# Patient Record
Sex: Female | Born: 1950 | Race: White | Hispanic: No | Marital: Married | Smoking: Current every day smoker
Health system: Southern US, Community
[De-identification: ages and names within clinical notes are randomized; demographics above are authoritative.]

## PROBLEM LIST (undated history)

## (undated) DIAGNOSIS — R7303 Prediabetes: Secondary | ICD-10-CM

## (undated) DIAGNOSIS — I10 Essential (primary) hypertension: Secondary | ICD-10-CM

## (undated) HISTORY — PX: ABDOMINAL HYSTERECTOMY: SHX81

---

## 2006-06-22 ENCOUNTER — Emergency Department: Payer: Self-pay | Admitting: Emergency Medicine

## 2013-08-06 ENCOUNTER — Ambulatory Visit: Payer: Self-pay

## 2019-02-08 ENCOUNTER — Other Ambulatory Visit: Payer: Self-pay

## 2019-02-08 DIAGNOSIS — Z20822 Contact with and (suspected) exposure to covid-19: Secondary | ICD-10-CM

## 2019-02-13 LAB — NOVEL CORONAVIRUS, NAA: SARS-CoV-2, NAA: DETECTED — AB

## 2019-06-10 ENCOUNTER — Emergency Department
Admission: EM | Admit: 2019-06-10 | Discharge: 2019-06-10 | Disposition: A | Discharge status: Home / Self Care | Payer: Medicare Other | Attending: Emergency Medicine | Admitting: Emergency Medicine

## 2019-06-10 ENCOUNTER — Other Ambulatory Visit: Payer: Self-pay

## 2019-06-10 ENCOUNTER — Emergency Department: Payer: Medicare Other

## 2019-06-10 DIAGNOSIS — F17210 Nicotine dependence, cigarettes, uncomplicated: Secondary | ICD-10-CM | POA: Insufficient documentation

## 2019-06-10 DIAGNOSIS — N3091 Cystitis, unspecified with hematuria: Secondary | ICD-10-CM | POA: Insufficient documentation

## 2019-06-10 DIAGNOSIS — I10 Essential (primary) hypertension: Secondary | ICD-10-CM | POA: Insufficient documentation

## 2019-06-10 DIAGNOSIS — N3090 Cystitis, unspecified without hematuria: Secondary | ICD-10-CM

## 2019-06-10 DIAGNOSIS — R319 Hematuria, unspecified: Secondary | ICD-10-CM | POA: Diagnosis present

## 2019-06-10 HISTORY — DX: Prediabetes: R73.03

## 2019-06-10 HISTORY — DX: Essential (primary) hypertension: I10

## 2019-06-10 LAB — URINALYSIS, COMPLETE (UACMP) WITH MICROSCOPIC
Bacteria, UA: NONE SEEN
RBC / HPF: >50 RBC/hpf — ABNORMAL HIGH (ref 0–5)
Specific Gravity, Urine: 1.01 (ref 1.005–1.030)
Squamous Epithelial / LPF: NONE SEEN (ref 0–5)

## 2019-06-10 LAB — BASIC METABOLIC PANEL
Anion gap: 12 (ref 5–15)
BUN: 13 mg/dL (ref 8–23)
CO2: 27 mmol/L (ref 22–32)
Calcium: 9.8 mg/dL (ref 8.9–10.3)
Chloride: 99 mmol/L (ref 98–111)
Creatinine, Ser: 1.05 mg/dL — ABNORMAL HIGH (ref 0.44–1.00)
GFR calc Af Amer: >60 mL/min (ref >60)
GFR calc non Af Amer: 54 mL/min — ABNORMAL LOW (ref >60)
Glucose, Bld: 121 mg/dL — ABNORMAL HIGH (ref 70–99)
Potassium: 3.9 mmol/L (ref 3.5–5.1)
Sodium: 138 mmol/L (ref 135–145)

## 2019-06-10 LAB — CBC
HCT: 48 % — ABNORMAL HIGH (ref 36.0–46.0)
Hemoglobin: 15.6 g/dL — ABNORMAL HIGH (ref 12.0–15.0)
MCH: 29.3 pg (ref 26.0–34.0)
MCHC: 32.5 g/dL (ref 30.0–36.0)
MCV: 90.2 fL (ref 80.0–100.0)
Platelets: 324 10*3/uL (ref 150–400)
RBC: 5.32 MIL/uL — ABNORMAL HIGH (ref 3.87–5.11)
RDW: 13 % (ref 11.5–15.5)
WBC: 15.7 10*3/uL — ABNORMAL HIGH (ref 4.0–10.5)
nRBC: 0 % (ref 0.0–0.2)

## 2019-06-10 MED ORDER — PHENAZOPYRIDINE HCL 200 MG PO TABS
200.0000 mg | ORAL_TABLET | Freq: Once | ORAL | Status: AC
  Administered 2019-06-10: 16:00:00 200 mg via ORAL
  Filled 2019-06-10: qty 1

## 2019-06-10 MED ORDER — SULFAMETHOXAZOLE-TRIMETHOPRIM 800-160 MG PO TABS
1.0000 | ORAL_TABLET | Freq: Once | ORAL | Status: AC
  Administered 2019-06-10: 1 via ORAL
  Filled 2019-06-10: qty 1

## 2019-06-10 MED ORDER — PHENAZOPYRIDINE HCL 200 MG PO TABS: 200.0000 mg | ORAL_TABLET | Freq: Three times a day (TID) | ORAL | 0 refills | Status: AC | PRN

## 2019-06-10 MED ORDER — SULFAMETHOXAZOLE-TRIMETHOPRIM 800-160 MG PO TABS: 1.0000 | ORAL_TABLET | Freq: Two times a day (BID) | ORAL | 0 refills | Status: DC

## 2019-06-10 NOTE — ED Triage Notes (Signed)
Pt states back pain x 3 days with urinary frequency. States hematuria. Hx of kidney stones. A&O, ambulatory.

## 2019-06-10 NOTE — ED Notes (Signed)
Dr Williams at bedside 

## 2019-06-10 NOTE — ED Provider Notes (Signed)
[facility] Emergency Department Provider Note       Time seen: ----------------------------------------- 1:48 PM on 06/10/2019 -----------------------------------------   I have reviewed the triage vital signs and the nursing notes.  HISTORY   Chief Complaint Hematuria    HPI Kristen Mcconnell is a 68 y.o. female with a history of hypertension, prediabetes who presents to the ED for back pain with 3 days of urinary frequency.  She has had hematuria with a history of kidney stones.  Discomfort is 5 out of 10 in her pelvis.  She denies fevers, chills or other complaints.  Past Medical History:  Diagnosis Date  . Hypertension   . Prediabetes     There are no active problems to display for this patient.   Past Surgical History:  Procedure Laterality Date  . ABDOMINAL HYSTERECTOMY      Allergies Penicillins  Social History Social History   Tobacco Use  . Smoking status: Current Every Day Smoker  Substance Use Topics  . Alcohol use: Not Currently  . Drug use: Not on file    Review of Systems Constitutional: Negative for fever. Cardiovascular: Negative for chest pain. Respiratory: Negative for shortness of breath. Gastrointestinal: Positive for pelvic pain Genitourinary: Positive for hematuria, urinary frequency Musculoskeletal: Positive for back pain Skin: Negative for rash. Neurological: Negative for headaches, focal weakness or numbness.  All systems negative/normal/unremarkable except as stated in the HPI  ____________________________________________   PHYSICAL EXAM:  VITAL SIGNS: ED Triage Vitals  Enc Vitals Group     BP 06/10/19 1112 (!) 155/92     Pulse Rate 06/10/19 1111 91     Resp 06/10/19 1111 18     Temp 06/10/19 1111 99.4 F (37.4 C)     Temp Source 06/10/19 1111 Oral     SpO2 06/10/19 1111 98 %     Weight 06/10/19 1112 230 lb (104.3 kg)     Height 06/10/19 1112 5\' 5"  (1.651 m)     Head Circumference --       Peak Flow --      Pain Score 06/10/19 1112 5     Pain Loc --      Pain Edu? --      Excl. in GC? --    Constitutional: Alert and oriented. Well appearing and in no distress. Eyes: Conjunctivae are normal. Normal extraocular movements. Cardiovascular: Normal rate, regular rhythm. No murmurs, rubs, or gallops. Respiratory: Normal respiratory effort without tachypnea nor retractions. Breath sounds are clear and equal bilaterally. No wheezes/rales/rhonchi. Gastrointestinal: Soft and nontender. Normal bowel sounds Musculoskeletal: Nontender with normal range of motion in extremities. No lower extremity tenderness nor edema. Neurologic:  Normal speech and language. No gross focal neurologic deficits are appreciated.  Skin:  Skin is warm, dry and intact. No rash noted. Psychiatric: Mood and affect are normal. Speech and behavior are normal.  ____________________________________________  ED COURSE:  As part of my medical decision making, I reviewed the following data within the electronic MEDICAL RECORD NUMBER History obtained from family if available, nursing notes, old chart and ekg, as well as notes from prior ED visits. Patient presented for possible renal colic, we will assess with labs and imaging as indicated at this time.   Procedures  Kristen Mcconnell was evaluated in Emergency Department on 06/10/2019 for the symptoms described in the history of present illness. She was evaluated in the context of the global COVID-19 pandemic, which necessitated consideration that the patient might be at risk for  infection with the SARS-CoV-2 virus that causes COVID-19. Institutional protocols and algorithms that pertain to the evaluation of patients at risk for COVID-19 are in a state of rapid change based on information released by regulatory bodies including the CDC and federal and state organizations. These policies and algorithms were followed during the patient's care in the ED.   ____________________________________________   LABS (pertinent positives/negatives)  Labs Reviewed  URINALYSIS, COMPLETE (UACMP) WITH MICROSCOPIC - Abnormal; Notable for the following components:      Result Value   Color, Urine RED (*)    APPearance CLOUDY (*)    Glucose, UA   (*)    Value: TEST NOT REPORTED DUE TO COLOR INTERFERENCE OF URINE PIGMENT   Hgb urine dipstick   (*)    Value: TEST NOT REPORTED DUE TO COLOR INTERFERENCE OF URINE PIGMENT   Bilirubin Urine   (*)    Value: TEST NOT REPORTED DUE TO COLOR INTERFERENCE OF URINE PIGMENT   Ketones, ur   (*)    Value: TEST NOT REPORTED DUE TO COLOR INTERFERENCE OF URINE PIGMENT   Protein, ur   (*)    Value: TEST NOT REPORTED DUE TO COLOR INTERFERENCE OF URINE PIGMENT   Nitrite   (*)    Value: TEST NOT REPORTED DUE TO COLOR INTERFERENCE OF URINE PIGMENT   Leukocytes,Ua   (*)    Value: TEST NOT REPORTED DUE TO COLOR INTERFERENCE OF URINE PIGMENT   RBC / HPF >50 (*)    All other components within normal limits  BASIC METABOLIC PANEL - Abnormal; Notable for the following components:   Glucose, Bld 121 (*)    Creatinine, Ser 1.05 (*)    GFR calc non Af Amer 54 (*)    All other components within normal limits  CBC - Abnormal; Notable for the following components:   WBC 15.7 (*)    RBC 5.32 (*)    Hemoglobin 15.6 (*)    HCT 48.0 (*)    All other components within normal limits    RADIOLOGY Images were viewed by me  CT renal protocol  IMPRESSION:  1. Negative for hydronephrosis or ureteral stone. Nonspecific  perinephric fat stranding.  2. Descending and sigmoid colon diverticular disease without acute  inflammatory change.  ____________________________________________   DIFFERENTIAL DIAGNOSIS   Renal colic, UTI, pyelonephritis, muscle strain  FINAL ASSESSMENT AND PLAN  Cystitis   Plan: The patient had presented for flank pain and hematuria. Patient's labs did reveal significant hematuria with leukocytosis.  Patient's imaging was unremarkable.  She will be given Pyridium and Bactrim.  Otherwise she is cleared for outpatient follow-up.   Laurence Aly, MD    Note: This note was generated in part or whole with voice recognition software. Voice recognition is usually quite accurate but there are transcription errors that can and very often do occur. I apologize for any typographical errors that were not detected and corrected.     Earleen Newport, MD 06/10/19 [phone]

## 2019-10-26 ENCOUNTER — Ambulatory Visit: Payer: Medicare Other | Attending: Internal Medicine

## 2019-10-26 DIAGNOSIS — Z23 Encounter for immunization: Secondary | ICD-10-CM

## 2019-10-26 NOTE — Progress Notes (Signed)
   Covid-19 Vaccination Clinic  Name:  Kristen Mcconnell    MRN: [id number] DOB: 1951-06-25  10/26/2019  [name] was observed post Covid-19 immunization for 15 minutes without incident. She was provided with Vaccine Information Sheet and instruction to access the V-Safe system.   [name] was instructed to call 911 with any severe reactions post vaccine: Marland Kitchen Difficulty breathing  . Swelling of face and throat  . A fast heartbeat  . A bad rash all over body  . Dizziness and weakness   Immunizations Administered    Name Date Dose VIS Date Route   Moderna COVID-19 Vaccine 10/26/2019  9:19 AM 0.5 mL 06/26/2019 Intramuscular   Manufacturer: Moderna   Lot: [id number]   NDC: 96722-773-75

## 2019-11-27 ENCOUNTER — Ambulatory Visit: Payer: Medicare Other

## 2021-01-05 IMAGING — CT CT RENAL STONE PROTOCOL
2 of 4 series · 17 of 46 positions shown, 19 images · non-contrast
Comparison: None.

CLINICAL DATA: Back pain with urinary frequency

EXAM:
CT ABDOMEN AND PELVIS WITHOUT CONTRAST
TECHNIQUE: Multidetector CT imaging of the abdomen and pelvis was performed
following the standard protocol without IV contrast.

[Series 2: stone full standard · axial · 0.85mm/px · z∈[-502,-102]mm · 14 of 88 slices shown, 16 images]
[im 4/88  soft-tissue]
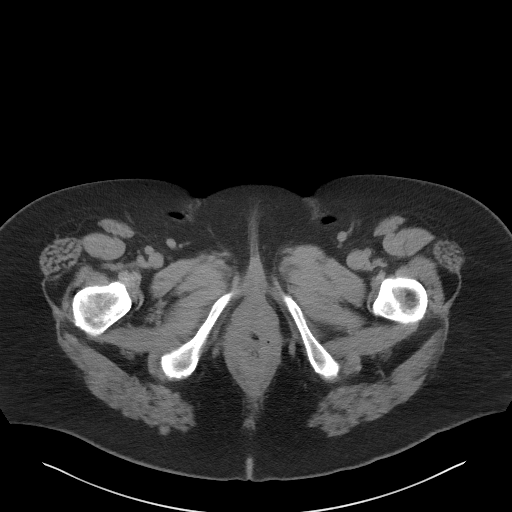
[im 4/88  bone]
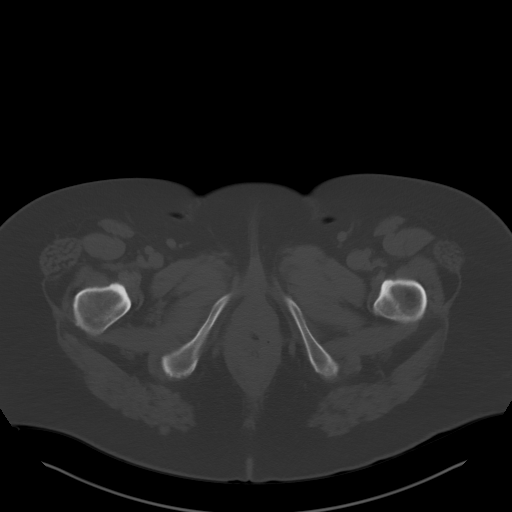
[im 12/88  soft-tissue]
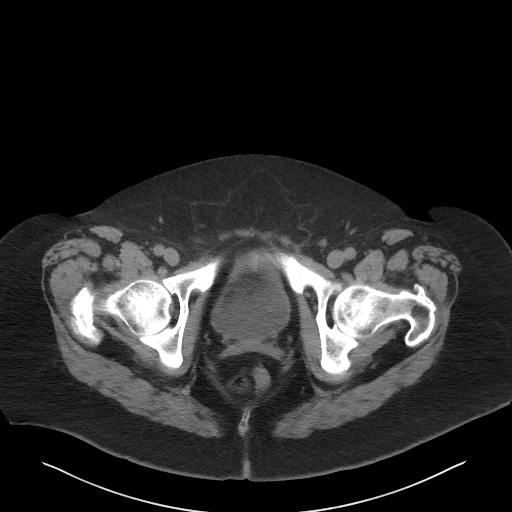
[im 16/88  soft-tissue]
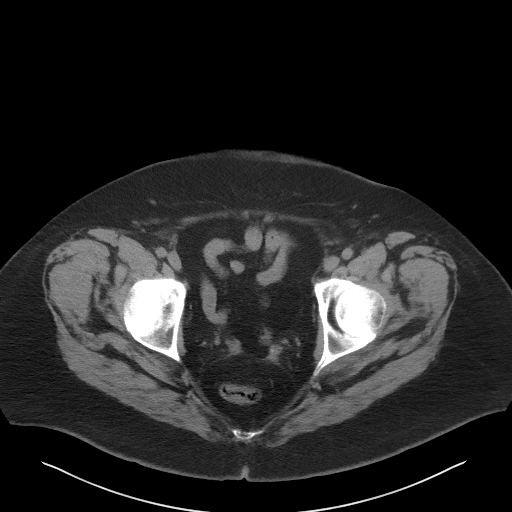
[im 23/88  soft-tissue]
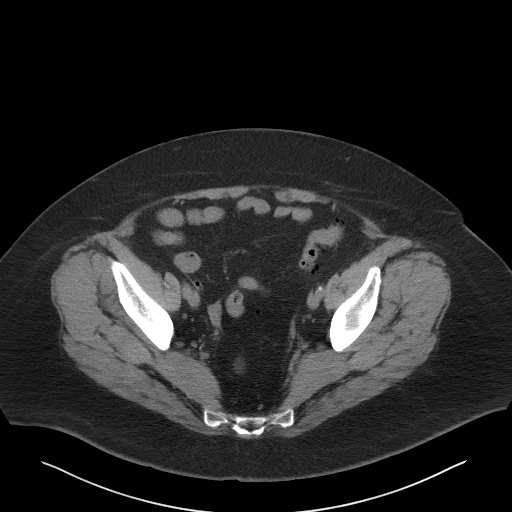
[im 31/88  soft-tissue]
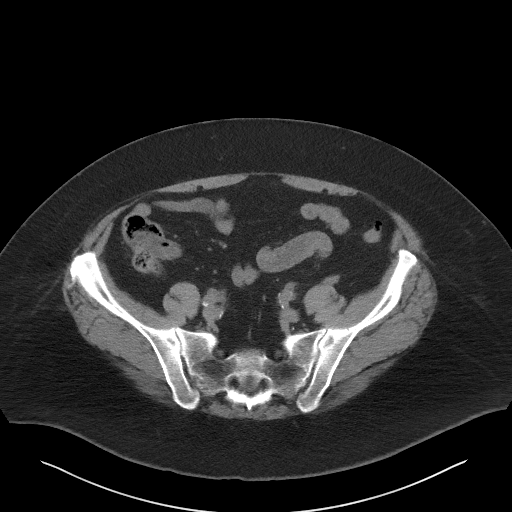
[im 35/88  soft-tissue]
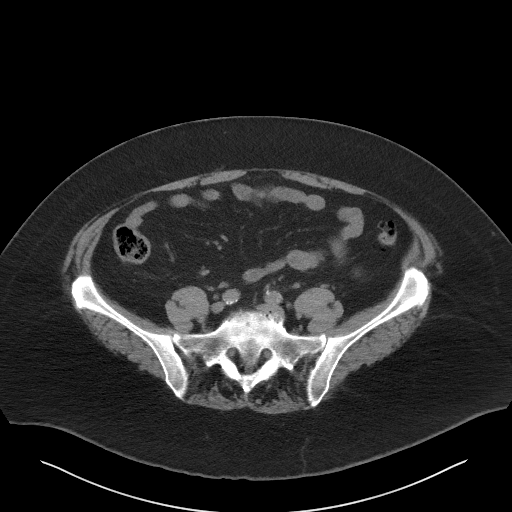
[im 42/88  soft-tissue]
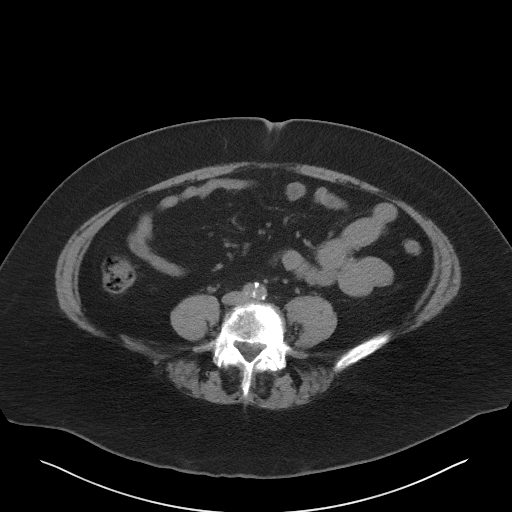
[im 46/88  soft-tissue]
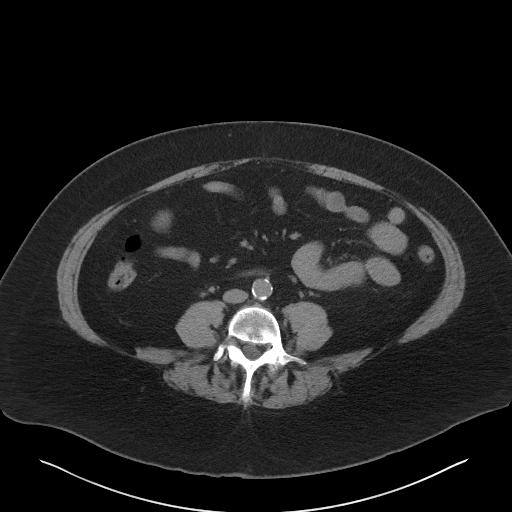
[im 53/88  soft-tissue]
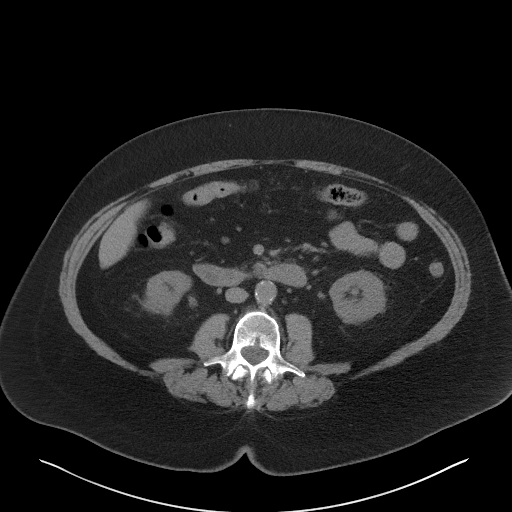
[im 53/88  bone]
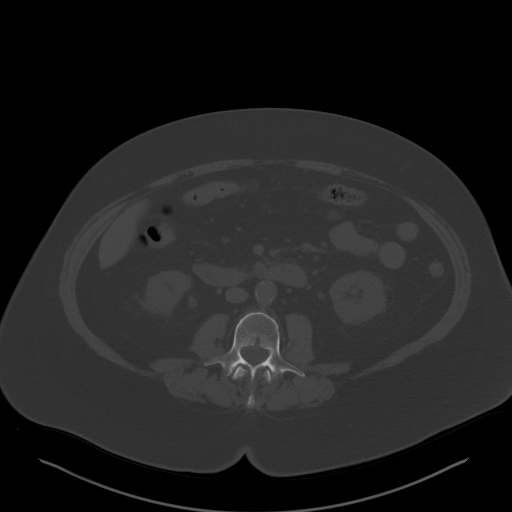
[im 57/88  soft-tissue]
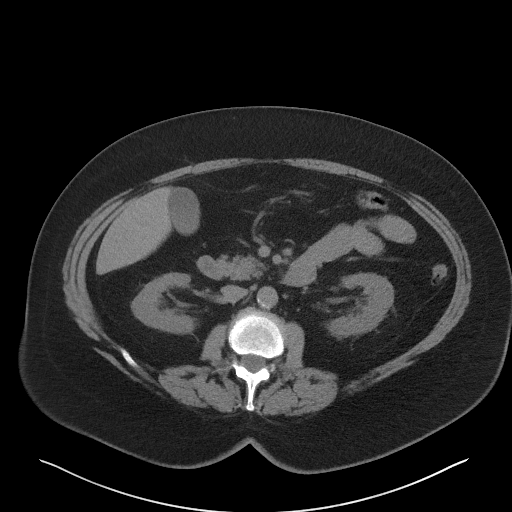
[im 65/88  soft-tissue]
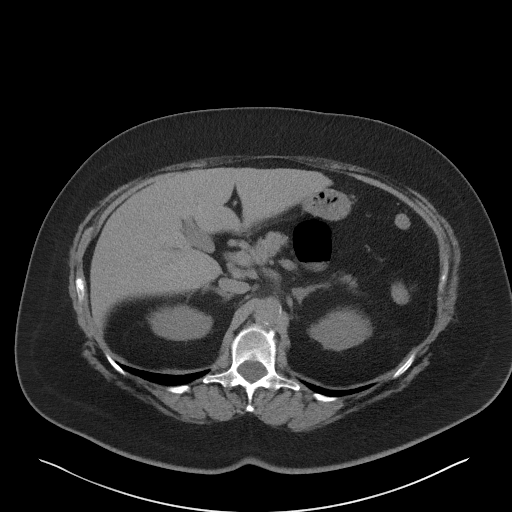
[im 72/88  soft-tissue]
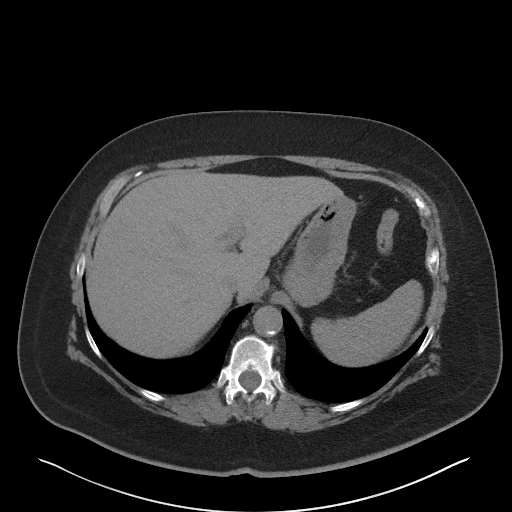
[im 76/88  soft-tissue]
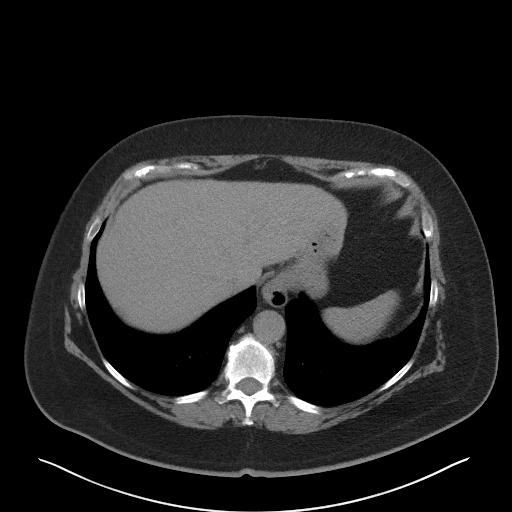
[im 84/88  soft-tissue]
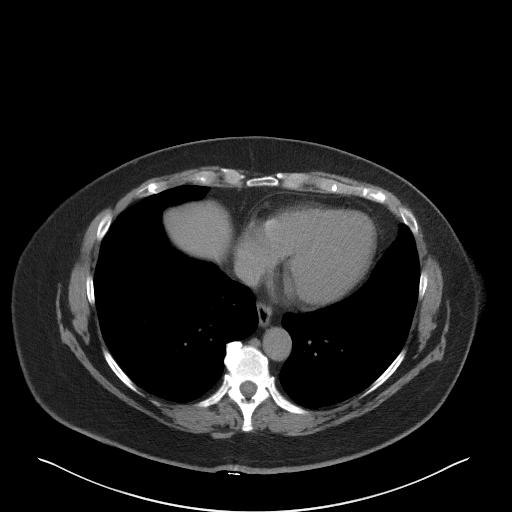

[Series 5: coronal · coronal · 0.85mm/px · 3 of 161 slices shown]
[im 54/161  soft-tissue]
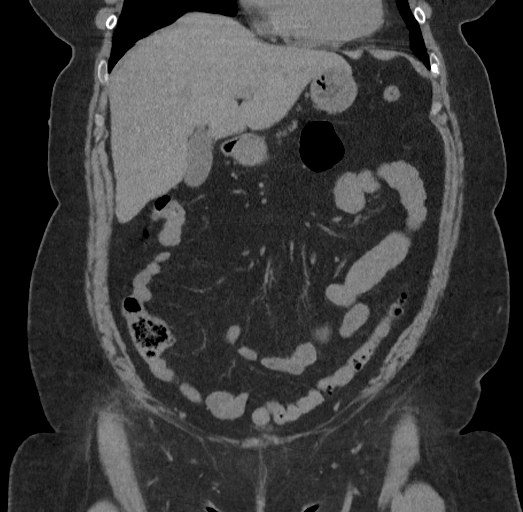
[im 72/161  soft-tissue]
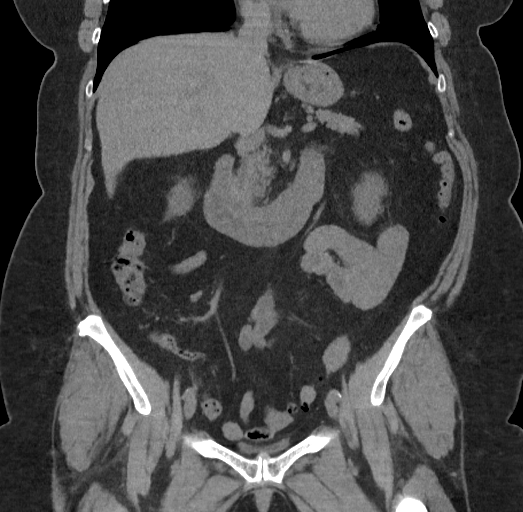
[im 89/161  soft-tissue]
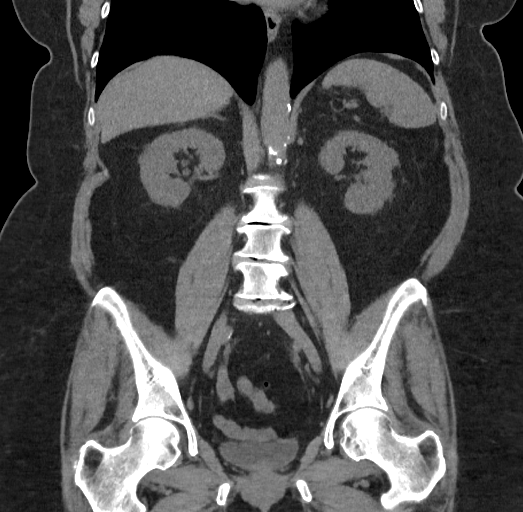

[17 of 46 positions shown; findings below may reference images not displayed]

FINDINGS: Lower chest: Lung bases demonstrate no acute consolidation or
effusion. Heart size within normal limits. Small hiatal hernia.

Hepatobiliary: No focal liver abnormality is seen. No gallstones,
gallbladder wall thickening, or biliary dilatation.

Pancreas: Unremarkable. No pancreatic ductal dilatation or
surrounding inflammatory changes.

Spleen: Normal in size without focal abnormality.

Adrenals/Urinary Tract: Adrenal glands are within normal limits.
Negative for hydronephrosis. Nonspecific perinephric fat stranding.
Urinary bladder is unremarkable.

Stomach/Bowel: Stomach is within normal limits. Appendix appears
normal. No evidence of bowel wall thickening, distention, or
inflammatory changes. Descending and sigmoid colon diverticular
disease without acute inflammatory change.

Vascular/Lymphatic: No significant adenopathy. Moderate aortic
atherosclerosis without aneurysm

Reproductive: Status post hysterectomy. No adnexal masses.

Other: Negative for free air or free fluid. Small fat in the
umbilical region

Musculoskeletal: Degenerative changes of the lower lumbar spine. No
acute osseous abnormality
IMPRESSION: 1. Negative for hydronephrosis or ureteral stone. Nonspecific
perinephric fat stranding.
2. Descending and sigmoid colon diverticular disease without acute
inflammatory change.

## 2023-06-09 ENCOUNTER — Encounter: Payer: Self-pay | Admitting: Emergency Medicine

## 2023-06-09 ENCOUNTER — Emergency Department
Admission: EM | Admit: 2023-06-09 | Discharge: 2023-06-09 | Disposition: A | Discharge status: Home / Self Care | Payer: Medicare Other | Attending: Emergency Medicine | Admitting: Emergency Medicine

## 2023-06-09 ENCOUNTER — Other Ambulatory Visit: Payer: Self-pay

## 2023-06-09 DIAGNOSIS — N390 Urinary tract infection, site not specified: Secondary | ICD-10-CM | POA: Diagnosis not present

## 2023-06-09 DIAGNOSIS — R300 Dysuria: Secondary | ICD-10-CM | POA: Diagnosis present

## 2023-06-09 LAB — CBC WITH DIFFERENTIAL/PLATELET
Abs Immature Granulocytes: 0.03 10*3/uL (ref 0.00–0.07)
Basophils Absolute: 0.1 10*3/uL (ref 0.0–0.1)
Basophils Relative: 1 %
Eosinophils Absolute: 0.4 10*3/uL (ref 0.0–0.5)
Eosinophils Relative: 3 %
HCT: 47.7 % — ABNORMAL HIGH (ref 36.0–46.0)
Hemoglobin: 15.2 g/dL — ABNORMAL HIGH (ref 12.0–15.0)
Immature Granulocytes: 0 %
Lymphocytes Relative: 31 %
Lymphs Abs: 3.7 10*3/uL (ref 0.7–4.0)
MCH: 29.5 pg (ref 26.0–34.0)
MCHC: 31.9 g/dL (ref 30.0–36.0)
MCV: 92.4 fL (ref 80.0–100.0)
Monocytes Absolute: 1.1 10*3/uL — ABNORMAL HIGH (ref 0.1–1.0)
Monocytes Relative: 9 %
Neutro Abs: 6.8 10*3/uL (ref 1.7–7.7)
Neutrophils Relative %: 56 %
Platelets: 277 10*3/uL (ref 150–400)
RBC: 5.16 MIL/uL — ABNORMAL HIGH (ref 3.87–5.11)
RDW: 13.4 % (ref 11.5–15.5)
WBC: 12 10*3/uL — ABNORMAL HIGH (ref 4.0–10.5)
nRBC: 0 % (ref 0.0–0.2)

## 2023-06-09 LAB — COMPREHENSIVE METABOLIC PANEL
ALT: 14 U/L (ref 0–44)
AST: 17 U/L (ref 15–41)
Albumin: 4 g/dL (ref 3.5–5.0)
Alkaline Phosphatase: 56 U/L (ref 38–126)
Anion gap: 8 (ref 5–15)
BUN: 7 mg/dL — ABNORMAL LOW (ref 8–23)
CO2: 25 mmol/L (ref 22–32)
Calcium: 9.1 mg/dL (ref 8.9–10.3)
Chloride: 103 mmol/L (ref 98–111)
Creatinine, Ser: 0.85 mg/dL (ref 0.44–1.00)
GFR, Estimated: >60 mL/min (ref >60)
Glucose, Bld: 121 mg/dL — ABNORMAL HIGH (ref 70–99)
Potassium: 3.7 mmol/L (ref 3.5–5.1)
Sodium: 136 mmol/L (ref 135–145)
Total Bilirubin: 0.2 mg/dL (ref <1.2)
Total Protein: 7.2 g/dL (ref 6.5–8.1)

## 2023-06-09 LAB — URINALYSIS, ROUTINE W REFLEX MICROSCOPIC
Bilirubin Urine: NEGATIVE
Glucose, UA: NEGATIVE mg/dL
Ketones, ur: NEGATIVE mg/dL
Nitrite: NEGATIVE
Protein, ur: 30 mg/dL — AB
RBC / HPF: >50 RBC/hpf (ref 0–5)
Specific Gravity, Urine: 1.006 (ref 1.005–1.030)
WBC, UA: >50 WBC/hpf (ref 0–5)
pH: 6 (ref 5.0–8.0)

## 2023-06-09 LAB — LIPASE, BLOOD: Lipase: 29 U/L (ref 11–51)

## 2023-06-09 MED ORDER — SULFAMETHOXAZOLE-TRIMETHOPRIM 800-160 MG PO TABS: 1.0000 | ORAL_TABLET | Freq: Two times a day (BID) | ORAL | 0 refills | Status: DC

## 2023-06-09 MED ORDER — SULFAMETHOXAZOLE-TRIMETHOPRIM 800-160 MG PO TABS
1.0000 | ORAL_TABLET | Freq: Once | ORAL | Status: AC
  Administered 2023-06-09: 1 via ORAL
  Filled 2023-06-09: qty 1

## 2023-06-09 MED ORDER — SULFAMETHOXAZOLE-TRIMETHOPRIM 800-160 MG PO TABS: 1.0000 | ORAL_TABLET | Freq: Two times a day (BID) | ORAL | 0 refills | Status: AC

## 2023-06-09 NOTE — ED Provider Notes (Signed)
[facility] Provider Note    Event Date/Time   First MD Initiated Contact with Patient 06/09/23 [phone]     (approximate)   History   Abdominal Pain   HPI  Kristen Mcconnell is a 72 y.o. female   Past medical history of pretension and prediabetic who presents to the emergency department with dysuria and frequency, suprapubic discomfort while urinating.  No back pain, no fevers.  3 days of symptoms.  Yesterday she started taking an old antibiotic that she had been prescribed several years ago for urinary tract infection.  She has had kidney stones in the past.  This pain feels different than her kidney stone flank pain.  Independent Historian contributed to assessment above: Her husband is at bedside to corroborate information given above  External Medical Documents Reviewed: Emergency department visit in 2020 for cystitis/hematuria      Physical Exam   Triage Vital Signs: ED Triage Vitals  Encounter Vitals Group     BP 06/09/23 0238 (!) 171/93     Systolic BP Percentile --      Diastolic BP Percentile --      Pulse Rate 06/09/23 0238 68     Resp 06/09/23 0238 18     Temp 06/09/23 0238 97.7 F (36.5 C)     Temp Source 06/09/23 0238 Oral     SpO2 06/09/23 0238 100 %     Weight 06/09/23 0234 209 lb (94.8 kg)     Height 06/09/23 0234 5\' 4"  (1.626 m)     Head Circumference --      Peak Flow --      Pain Score 06/09/23 0234 7     Pain Loc --      Pain Education --      Exclude from Growth Chart --     Most recent vital signs: Vitals:   06/09/23 0238  BP: (!) 171/93  Pulse: 68  Resp: 18  Temp: 97.7 F (36.5 C)  SpO2: 100%    General: Awake, no distress.  CV:  Good peripheral perfusion.  Resp:  Normal effort.  Abd:  No distention.  Other:  Awake alert comfortable hypertensive otherwise vital signs normal, afebrile, soft nontender abdomen to deep palpation in all quadrants, no CVA tenderness.   ED Results / Procedures / Treatments    Labs (all labs ordered are listed, but only abnormal results are displayed) Labs Reviewed  CBC WITH DIFFERENTIAL/PLATELET - Abnormal; Notable for the following components:      Result Value   WBC 12.0 (*)    RBC 5.16 (*)    Hemoglobin 15.2 (*)    HCT 47.7 (*)    Monocytes Absolute 1.1 (*)    All other components within normal limits  COMPREHENSIVE METABOLIC PANEL - Abnormal; Notable for the following components:   Glucose, Bld 121 (*)    BUN 7 (*)    All other components within normal limits  URINALYSIS, ROUTINE W REFLEX MICROSCOPIC - Abnormal; Notable for the following components:   Color, Urine YELLOW (*)    APPearance CLOUDY (*)    Hgb urine dipstick LARGE (*)    Protein, ur 30 (*)    Leukocytes,Ua LARGE (*)    Bacteria, UA FEW (*)    All other components within normal limits  URINE CULTURE  LIPASE, BLOOD     I ordered and reviewed the above labs they are notable for white blood cell count mildly elevated 12.0, renal function normal  PROCEDURES:  Critical Care performed: No  Procedures   MEDICATIONS ORDERED IN ED: Medications  sulfamethoxazole-trimethoprim (BACTRIM DS) 800-160 MG per tablet 1 tablet (has no administration in time range)     IMPRESSION / MDM / ASSESSMENT AND PLAN / ED COURSE  I reviewed the triage vital signs and the nursing notes.                                Patient's presentation is most consistent with acute presentation with potential threat to life or bodily function.  Differential diagnosis includes, but is not limited to, lower urinary tract infection, pyelonephritis, renal colic --- sepsis or intra-abdominal infection like appendicitis considered but less likely   The patient is on the cardiac monitor to evaluate for evidence of arrhythmia and/or significant heart rate changes.  MDM:    Most consistent with lower urinary tract infection with dysuria, frequency, suprapubic pain.  No systemic signs of infection, appears  nontoxic doubt sepsis.  Mild leukocytosis, renal function is normal, without flank pain I doubt pyelonephritis or renal colic/stone.  I doubt surgical abdominal pathology given benign abdominal exam.  UTI suggested by urinalysis results, culture sent.  Bactrim antibiotic with first dose in the emergency department given.  Discharge.      FINAL CLINICAL IMPRESSION(S) / ED DIAGNOSES   Final diagnoses:  Lower urinary tract infectious disease     Rx / DC Orders   ED Discharge Orders          Ordered    sulfamethoxazole-trimethoprim (BACTRIM DS) 800-160 MG tablet  2 times daily,   Status:  Discontinued        06/09/23 0609    sulfamethoxazole-trimethoprim (BACTRIM DS) 800-160 MG tablet  2 times daily        06/09/23 0609             Note:  This document was prepared using Dragon voice recognition software and may include unintentional dictation errors.    Pilar Jarvis, MD 06/09/23 [phone]

## 2023-06-09 NOTE — Discharge Instructions (Addendum)
You have a urinary tract infection.  Take Bactrim antibiotic for the full course as prescribed.  You have a history of kidney stones but this time the pain does not match a kidney stone.  If you do have a small kidney stone, it should pass with time.  Take ibuprofen 400 mg every 6 hours as needed for pain.  This can be taken with Tylenol 650 mg every 6 hours as well.  Thank you for choosing [place] for your health care today!  Please see your primary doctor this week for a follow up appointment.   If you have any new, worsening, or unexpected symptoms call your doctor right away or come back to the emergency department for reevaluation.  It was my pleasure to care for you today.   Daneil Dan [place] Charon, MD

## 2023-06-09 NOTE — ED Triage Notes (Signed)
Patient ambulatory to triage with steady gait, without difficulty or distress noted; pt reports for last couple days having lower abd pain and diff urinating; st hx kidney stones, denies any back/flank pain

## 2023-06-11 LAB — URINE CULTURE: Culture: 70000 — AB
